# Patient Record
Sex: Female | Born: 1950 | Race: White | Hispanic: No | Marital: Married
Health system: Southern US, Community
[De-identification: ages and names within clinical notes are randomized; demographics above are authoritative.]

---

## 2012-10-15 ENCOUNTER — Ambulatory Visit (INDEPENDENT_AMBULATORY_CARE_PROVIDER_SITE_OTHER): Payer: Medicare Other

## 2012-10-15 ENCOUNTER — Other Ambulatory Visit: Payer: Self-pay | Admitting: Family Medicine

## 2012-10-15 ENCOUNTER — Other Ambulatory Visit (HOSPITAL_BASED_OUTPATIENT_CLINIC_OR_DEPARTMENT_OTHER): Payer: Self-pay | Admitting: Family Medicine

## 2012-10-15 DIAGNOSIS — Z1231 Encounter for screening mammogram for malignant neoplasm of breast: Secondary | ICD-10-CM

## 2012-10-15 DIAGNOSIS — R928 Other abnormal and inconclusive findings on diagnostic imaging of breast: Secondary | ICD-10-CM

## 2012-10-15 DIAGNOSIS — Z780 Asymptomatic menopausal state: Secondary | ICD-10-CM

## 2012-10-15 DIAGNOSIS — Z13820 Encounter for screening for osteoporosis: Secondary | ICD-10-CM

## 2012-10-17 ENCOUNTER — Other Ambulatory Visit: Payer: Self-pay | Admitting: Family Medicine

## 2012-10-17 DIAGNOSIS — R928 Other abnormal and inconclusive findings on diagnostic imaging of breast: Secondary | ICD-10-CM

## 2012-10-30 ENCOUNTER — Ambulatory Visit
Admission: RE | Admit: 2012-10-30 | Discharge: 2012-10-30 | Disposition: A | Discharge status: Home / Self Care | Payer: Medicare Other | Source: Ambulatory Visit | Attending: Family Medicine | Admitting: Family Medicine

## 2012-10-30 DIAGNOSIS — R928 Other abnormal and inconclusive findings on diagnostic imaging of breast: Secondary | ICD-10-CM

## 2013-11-18 ENCOUNTER — Telehealth: Payer: Self-pay | Admitting: *Deleted

## 2013-11-18 NOTE — Telephone Encounter (Signed)
opened in error

## 2014-08-13 ENCOUNTER — Other Ambulatory Visit: Payer: Self-pay | Admitting: Family Medicine

## 2014-08-13 DIAGNOSIS — Z1231 Encounter for screening mammogram for malignant neoplasm of breast: Secondary | ICD-10-CM

## 2014-10-01 DIAGNOSIS — Z7901 Long term (current) use of anticoagulants: Secondary | ICD-10-CM | POA: Diagnosis not present

## 2014-10-19 DIAGNOSIS — Z79899 Other long term (current) drug therapy: Secondary | ICD-10-CM | POA: Diagnosis not present

## 2014-10-19 DIAGNOSIS — K219 Gastro-esophageal reflux disease without esophagitis: Secondary | ICD-10-CM | POA: Diagnosis not present

## 2014-10-19 DIAGNOSIS — R42 Dizziness and giddiness: Secondary | ICD-10-CM | POA: Diagnosis not present

## 2014-10-19 DIAGNOSIS — D649 Anemia, unspecified: Secondary | ICD-10-CM | POA: Diagnosis not present

## 2014-10-19 DIAGNOSIS — R55 Syncope and collapse: Secondary | ICD-10-CM | POA: Diagnosis not present

## 2014-10-19 DIAGNOSIS — I491 Atrial premature depolarization: Secondary | ICD-10-CM | POA: Diagnosis not present

## 2014-10-27 DIAGNOSIS — I890 Lymphedema, not elsewhere classified: Secondary | ICD-10-CM | POA: Diagnosis not present

## 2014-10-27 DIAGNOSIS — I83018 Varicose veins of right lower extremity with ulcer other part of lower leg: Secondary | ICD-10-CM | POA: Diagnosis not present

## 2014-10-27 DIAGNOSIS — I83028 Varicose veins of left lower extremity with ulcer other part of lower leg: Secondary | ICD-10-CM | POA: Diagnosis not present

## 2014-10-27 DIAGNOSIS — I872 Venous insufficiency (chronic) (peripheral): Secondary | ICD-10-CM | POA: Diagnosis not present

## 2014-10-28 ENCOUNTER — Ambulatory Visit: Payer: Medicare Other

## 2014-11-02 DIAGNOSIS — I83018 Varicose veins of right lower extremity with ulcer other part of lower leg: Secondary | ICD-10-CM | POA: Diagnosis not present

## 2014-11-02 DIAGNOSIS — I872 Venous insufficiency (chronic) (peripheral): Secondary | ICD-10-CM | POA: Diagnosis not present

## 2014-11-02 DIAGNOSIS — I83028 Varicose veins of left lower extremity with ulcer other part of lower leg: Secondary | ICD-10-CM | POA: Diagnosis not present

## 2014-11-02 DIAGNOSIS — I890 Lymphedema, not elsewhere classified: Secondary | ICD-10-CM | POA: Diagnosis not present

## 2014-11-04 ENCOUNTER — Ambulatory Visit (INDEPENDENT_AMBULATORY_CARE_PROVIDER_SITE_OTHER): Payer: Medicare Other

## 2014-11-04 DIAGNOSIS — Z1231 Encounter for screening mammogram for malignant neoplasm of breast: Secondary | ICD-10-CM | POA: Diagnosis not present

## 2014-11-06 DIAGNOSIS — I83019 Varicose veins of right lower extremity with ulcer of unspecified site: Secondary | ICD-10-CM | POA: Diagnosis not present

## 2014-11-06 DIAGNOSIS — I83029 Varicose veins of left lower extremity with ulcer of unspecified site: Secondary | ICD-10-CM | POA: Diagnosis not present

## 2014-11-06 DIAGNOSIS — I872 Venous insufficiency (chronic) (peripheral): Secondary | ICD-10-CM | POA: Diagnosis not present

## 2014-11-06 DIAGNOSIS — L03119 Cellulitis of unspecified part of limb: Secondary | ICD-10-CM | POA: Diagnosis not present

## 2014-11-06 DIAGNOSIS — I890 Lymphedema, not elsewhere classified: Secondary | ICD-10-CM | POA: Diagnosis not present

## 2014-11-11 DIAGNOSIS — I83028 Varicose veins of left lower extremity with ulcer other part of lower leg: Secondary | ICD-10-CM | POA: Diagnosis not present

## 2014-11-11 DIAGNOSIS — I890 Lymphedema, not elsewhere classified: Secondary | ICD-10-CM | POA: Diagnosis not present

## 2014-11-11 DIAGNOSIS — I872 Venous insufficiency (chronic) (peripheral): Secondary | ICD-10-CM | POA: Diagnosis not present

## 2014-11-11 DIAGNOSIS — I83018 Varicose veins of right lower extremity with ulcer other part of lower leg: Secondary | ICD-10-CM | POA: Diagnosis not present

## 2014-11-16 DIAGNOSIS — Z1211 Encounter for screening for malignant neoplasm of colon: Secondary | ICD-10-CM | POA: Diagnosis not present

## 2014-11-16 DIAGNOSIS — Z7901 Long term (current) use of anticoagulants: Secondary | ICD-10-CM | POA: Diagnosis not present

## 2014-11-16 DIAGNOSIS — Z79899 Other long term (current) drug therapy: Secondary | ICD-10-CM | POA: Diagnosis not present

## 2014-11-16 DIAGNOSIS — D649 Anemia, unspecified: Secondary | ICD-10-CM | POA: Diagnosis not present

## 2014-11-18 DIAGNOSIS — I83018 Varicose veins of right lower extremity with ulcer other part of lower leg: Secondary | ICD-10-CM | POA: Diagnosis not present

## 2014-11-18 DIAGNOSIS — D500 Iron deficiency anemia secondary to blood loss (chronic): Secondary | ICD-10-CM | POA: Diagnosis not present

## 2014-11-18 DIAGNOSIS — Z86711 Personal history of pulmonary embolism: Secondary | ICD-10-CM | POA: Diagnosis not present

## 2014-11-18 DIAGNOSIS — I83028 Varicose veins of left lower extremity with ulcer other part of lower leg: Secondary | ICD-10-CM | POA: Diagnosis not present

## 2014-11-18 DIAGNOSIS — Z7901 Long term (current) use of anticoagulants: Secondary | ICD-10-CM | POA: Diagnosis not present

## 2014-11-23 DIAGNOSIS — D500 Iron deficiency anemia secondary to blood loss (chronic): Secondary | ICD-10-CM | POA: Diagnosis not present

## 2014-11-23 DIAGNOSIS — Z86711 Personal history of pulmonary embolism: Secondary | ICD-10-CM | POA: Diagnosis not present

## 2014-11-23 DIAGNOSIS — Z7901 Long term (current) use of anticoagulants: Secondary | ICD-10-CM | POA: Diagnosis not present

## 2014-11-25 DIAGNOSIS — D509 Iron deficiency anemia, unspecified: Secondary | ICD-10-CM | POA: Diagnosis not present

## 2014-11-25 DIAGNOSIS — I83018 Varicose veins of right lower extremity with ulcer other part of lower leg: Secondary | ICD-10-CM | POA: Diagnosis not present

## 2014-11-25 DIAGNOSIS — I83028 Varicose veins of left lower extremity with ulcer other part of lower leg: Secondary | ICD-10-CM | POA: Diagnosis not present

## 2014-11-25 DIAGNOSIS — I872 Venous insufficiency (chronic) (peripheral): Secondary | ICD-10-CM | POA: Diagnosis not present

## 2014-11-25 DIAGNOSIS — R195 Other fecal abnormalities: Secondary | ICD-10-CM | POA: Diagnosis not present

## 2014-11-25 DIAGNOSIS — I890 Lymphedema, not elsewhere classified: Secondary | ICD-10-CM | POA: Diagnosis not present

## 2014-11-30 DIAGNOSIS — D500 Iron deficiency anemia secondary to blood loss (chronic): Secondary | ICD-10-CM | POA: Diagnosis not present

## 2014-11-30 DIAGNOSIS — I2699 Other pulmonary embolism without acute cor pulmonale: Secondary | ICD-10-CM | POA: Diagnosis not present

## 2014-11-30 DIAGNOSIS — Z7901 Long term (current) use of anticoagulants: Secondary | ICD-10-CM | POA: Diagnosis not present

## 2014-12-02 DIAGNOSIS — I890 Lymphedema, not elsewhere classified: Secondary | ICD-10-CM | POA: Diagnosis not present

## 2014-12-02 DIAGNOSIS — I872 Venous insufficiency (chronic) (peripheral): Secondary | ICD-10-CM | POA: Diagnosis not present

## 2014-12-02 DIAGNOSIS — I83018 Varicose veins of right lower extremity with ulcer other part of lower leg: Secondary | ICD-10-CM | POA: Diagnosis not present

## 2014-12-02 DIAGNOSIS — I83028 Varicose veins of left lower extremity with ulcer other part of lower leg: Secondary | ICD-10-CM | POA: Diagnosis not present

## 2014-12-02 DIAGNOSIS — I83005 Varicose veins of unspecified lower extremity with ulcer other part of foot: Secondary | ICD-10-CM | POA: Diagnosis not present

## 2014-12-07 DIAGNOSIS — Z7901 Long term (current) use of anticoagulants: Secondary | ICD-10-CM | POA: Diagnosis not present

## 2014-12-07 DIAGNOSIS — D500 Iron deficiency anemia secondary to blood loss (chronic): Secondary | ICD-10-CM | POA: Diagnosis not present

## 2014-12-07 DIAGNOSIS — I2699 Other pulmonary embolism without acute cor pulmonale: Secondary | ICD-10-CM | POA: Diagnosis not present

## 2014-12-09 DIAGNOSIS — I83028 Varicose veins of left lower extremity with ulcer other part of lower leg: Secondary | ICD-10-CM | POA: Diagnosis not present

## 2014-12-09 DIAGNOSIS — I872 Venous insufficiency (chronic) (peripheral): Secondary | ICD-10-CM | POA: Diagnosis not present

## 2014-12-09 DIAGNOSIS — I83018 Varicose veins of right lower extremity with ulcer other part of lower leg: Secondary | ICD-10-CM | POA: Diagnosis not present

## 2014-12-09 DIAGNOSIS — I83015 Varicose veins of right lower extremity with ulcer other part of foot: Secondary | ICD-10-CM | POA: Diagnosis not present

## 2014-12-09 DIAGNOSIS — I890 Lymphedema, not elsewhere classified: Secondary | ICD-10-CM | POA: Diagnosis not present

## 2014-12-11 DIAGNOSIS — Z79899 Other long term (current) drug therapy: Secondary | ICD-10-CM | POA: Diagnosis not present

## 2014-12-11 DIAGNOSIS — K5730 Diverticulosis of large intestine without perforation or abscess without bleeding: Secondary | ICD-10-CM | POA: Diagnosis not present

## 2014-12-11 DIAGNOSIS — D509 Iron deficiency anemia, unspecified: Secondary | ICD-10-CM | POA: Diagnosis not present

## 2014-12-11 DIAGNOSIS — E785 Hyperlipidemia, unspecified: Secondary | ICD-10-CM | POA: Diagnosis not present

## 2014-12-11 DIAGNOSIS — Z6841 Body Mass Index (BMI) 40.0 and over, adult: Secondary | ICD-10-CM | POA: Diagnosis not present

## 2014-12-11 DIAGNOSIS — K219 Gastro-esophageal reflux disease without esophagitis: Secondary | ICD-10-CM | POA: Diagnosis not present

## 2014-12-11 DIAGNOSIS — K648 Other hemorrhoids: Secondary | ICD-10-CM | POA: Diagnosis not present

## 2014-12-11 DIAGNOSIS — R195 Other fecal abnormalities: Secondary | ICD-10-CM | POA: Diagnosis not present

## 2014-12-11 DIAGNOSIS — K921 Melena: Secondary | ICD-10-CM | POA: Diagnosis not present

## 2014-12-11 DIAGNOSIS — Z7902 Long term (current) use of antithrombotics/antiplatelets: Secondary | ICD-10-CM | POA: Diagnosis not present

## 2014-12-11 DIAGNOSIS — Z86711 Personal history of pulmonary embolism: Secondary | ICD-10-CM | POA: Diagnosis not present

## 2014-12-16 DIAGNOSIS — I83018 Varicose veins of right lower extremity with ulcer other part of lower leg: Secondary | ICD-10-CM | POA: Diagnosis not present

## 2014-12-16 DIAGNOSIS — I83028 Varicose veins of left lower extremity with ulcer other part of lower leg: Secondary | ICD-10-CM | POA: Diagnosis not present

## 2014-12-16 DIAGNOSIS — I872 Venous insufficiency (chronic) (peripheral): Secondary | ICD-10-CM | POA: Diagnosis not present

## 2014-12-16 DIAGNOSIS — I890 Lymphedema, not elsewhere classified: Secondary | ICD-10-CM | POA: Diagnosis not present

## 2014-12-23 DIAGNOSIS — I890 Lymphedema, not elsewhere classified: Secondary | ICD-10-CM | POA: Diagnosis not present

## 2014-12-23 DIAGNOSIS — I83028 Varicose veins of left lower extremity with ulcer other part of lower leg: Secondary | ICD-10-CM | POA: Diagnosis not present

## 2014-12-23 DIAGNOSIS — I83018 Varicose veins of right lower extremity with ulcer other part of lower leg: Secondary | ICD-10-CM | POA: Diagnosis not present

## 2014-12-23 DIAGNOSIS — I872 Venous insufficiency (chronic) (peripheral): Secondary | ICD-10-CM | POA: Diagnosis not present

## 2014-12-30 DIAGNOSIS — I83019 Varicose veins of right lower extremity with ulcer of unspecified site: Secondary | ICD-10-CM | POA: Diagnosis not present

## 2014-12-30 DIAGNOSIS — I872 Venous insufficiency (chronic) (peripheral): Secondary | ICD-10-CM | POA: Diagnosis not present

## 2014-12-30 DIAGNOSIS — I890 Lymphedema, not elsewhere classified: Secondary | ICD-10-CM | POA: Diagnosis not present

## 2014-12-30 DIAGNOSIS — I83029 Varicose veins of left lower extremity with ulcer of unspecified site: Secondary | ICD-10-CM | POA: Diagnosis not present

## 2015-01-15 DIAGNOSIS — I83018 Varicose veins of right lower extremity with ulcer other part of lower leg: Secondary | ICD-10-CM | POA: Diagnosis not present

## 2015-01-15 DIAGNOSIS — Z7901 Long term (current) use of anticoagulants: Secondary | ICD-10-CM | POA: Diagnosis not present

## 2015-01-15 DIAGNOSIS — Z6841 Body Mass Index (BMI) 40.0 and over, adult: Secondary | ICD-10-CM | POA: Diagnosis not present

## 2015-01-15 DIAGNOSIS — I83028 Varicose veins of left lower extremity with ulcer other part of lower leg: Secondary | ICD-10-CM | POA: Diagnosis not present

## 2015-01-15 DIAGNOSIS — L97821 Non-pressure chronic ulcer of other part of left lower leg limited to breakdown of skin: Secondary | ICD-10-CM | POA: Diagnosis not present

## 2015-01-15 DIAGNOSIS — I890 Lymphedema, not elsewhere classified: Secondary | ICD-10-CM | POA: Diagnosis not present

## 2015-01-15 DIAGNOSIS — I872 Venous insufficiency (chronic) (peripheral): Secondary | ICD-10-CM | POA: Diagnosis not present

## 2015-01-15 DIAGNOSIS — L97811 Non-pressure chronic ulcer of other part of right lower leg limited to breakdown of skin: Secondary | ICD-10-CM | POA: Diagnosis not present

## 2015-02-25 DIAGNOSIS — L97829 Non-pressure chronic ulcer of other part of left lower leg with unspecified severity: Secondary | ICD-10-CM | POA: Diagnosis not present

## 2015-02-25 DIAGNOSIS — Z6841 Body Mass Index (BMI) 40.0 and over, adult: Secondary | ICD-10-CM | POA: Diagnosis not present

## 2015-02-25 DIAGNOSIS — I890 Lymphedema, not elsewhere classified: Secondary | ICD-10-CM | POA: Diagnosis not present

## 2015-02-25 DIAGNOSIS — I872 Venous insufficiency (chronic) (peripheral): Secondary | ICD-10-CM | POA: Diagnosis not present

## 2015-02-25 DIAGNOSIS — I83028 Varicose veins of left lower extremity with ulcer other part of lower leg: Secondary | ICD-10-CM | POA: Diagnosis not present

## 2015-02-26 DIAGNOSIS — Z7901 Long term (current) use of anticoagulants: Secondary | ICD-10-CM | POA: Diagnosis not present

## 2015-03-10 DIAGNOSIS — I890 Lymphedema, not elsewhere classified: Secondary | ICD-10-CM | POA: Diagnosis not present

## 2015-03-10 DIAGNOSIS — I83029 Varicose veins of left lower extremity with ulcer of unspecified site: Secondary | ICD-10-CM | POA: Diagnosis not present

## 2015-03-10 DIAGNOSIS — I872 Venous insufficiency (chronic) (peripheral): Secondary | ICD-10-CM | POA: Diagnosis not present

## 2015-03-25 DIAGNOSIS — S80822A Blister (nonthermal), left lower leg, initial encounter: Secondary | ICD-10-CM | POA: Diagnosis not present

## 2015-03-25 DIAGNOSIS — I872 Venous insufficiency (chronic) (peripheral): Secondary | ICD-10-CM | POA: Diagnosis not present

## 2015-03-25 DIAGNOSIS — L97829 Non-pressure chronic ulcer of other part of left lower leg with unspecified severity: Secondary | ICD-10-CM | POA: Diagnosis not present

## 2015-03-25 DIAGNOSIS — I890 Lymphedema, not elsewhere classified: Secondary | ICD-10-CM | POA: Diagnosis not present

## 2015-03-26 DIAGNOSIS — I872 Venous insufficiency (chronic) (peripheral): Secondary | ICD-10-CM | POA: Diagnosis not present

## 2015-04-01 DIAGNOSIS — Z7901 Long term (current) use of anticoagulants: Secondary | ICD-10-CM | POA: Diagnosis not present

## 2015-04-08 DIAGNOSIS — I890 Lymphedema, not elsewhere classified: Secondary | ICD-10-CM | POA: Diagnosis not present

## 2015-04-08 DIAGNOSIS — I83029 Varicose veins of left lower extremity with ulcer of unspecified site: Secondary | ICD-10-CM | POA: Diagnosis not present

## 2015-04-08 DIAGNOSIS — I872 Venous insufficiency (chronic) (peripheral): Secondary | ICD-10-CM | POA: Diagnosis not present

## 2015-04-19 DIAGNOSIS — Z7901 Long term (current) use of anticoagulants: Secondary | ICD-10-CM | POA: Diagnosis not present

## 2015-04-19 DIAGNOSIS — Z86711 Personal history of pulmonary embolism: Secondary | ICD-10-CM | POA: Diagnosis not present

## 2015-04-19 DIAGNOSIS — D509 Iron deficiency anemia, unspecified: Secondary | ICD-10-CM | POA: Diagnosis not present

## 2015-04-22 DIAGNOSIS — I872 Venous insufficiency (chronic) (peripheral): Secondary | ICD-10-CM | POA: Diagnosis not present

## 2015-04-22 DIAGNOSIS — I890 Lymphedema, not elsewhere classified: Secondary | ICD-10-CM | POA: Diagnosis not present

## 2015-04-22 DIAGNOSIS — I83029 Varicose veins of left lower extremity with ulcer of unspecified site: Secondary | ICD-10-CM | POA: Diagnosis not present

## 2015-04-27 DIAGNOSIS — H02831 Dermatochalasis of right upper eyelid: Secondary | ICD-10-CM | POA: Diagnosis not present

## 2015-04-27 DIAGNOSIS — H33301 Unspecified retinal break, right eye: Secondary | ICD-10-CM | POA: Diagnosis not present

## 2015-04-27 DIAGNOSIS — H527 Unspecified disorder of refraction: Secondary | ICD-10-CM | POA: Diagnosis not present

## 2015-04-27 DIAGNOSIS — H43811 Vitreous degeneration, right eye: Secondary | ICD-10-CM | POA: Diagnosis not present

## 2015-04-27 DIAGNOSIS — H4311 Vitreous hemorrhage, right eye: Secondary | ICD-10-CM | POA: Diagnosis not present

## 2015-04-27 DIAGNOSIS — H33321 Round hole, right eye: Secondary | ICD-10-CM | POA: Diagnosis not present

## 2015-04-27 DIAGNOSIS — H25813 Combined forms of age-related cataract, bilateral: Secondary | ICD-10-CM | POA: Diagnosis not present

## 2015-04-29 DIAGNOSIS — Z7901 Long term (current) use of anticoagulants: Secondary | ICD-10-CM | POA: Diagnosis not present

## 2015-05-07 DIAGNOSIS — Z7901 Long term (current) use of anticoagulants: Secondary | ICD-10-CM | POA: Diagnosis not present

## 2015-05-11 DIAGNOSIS — L97909 Non-pressure chronic ulcer of unspecified part of unspecified lower leg with unspecified severity: Secondary | ICD-10-CM | POA: Diagnosis not present

## 2015-05-11 DIAGNOSIS — I83029 Varicose veins of left lower extremity with ulcer of unspecified site: Secondary | ICD-10-CM | POA: Diagnosis not present

## 2015-05-11 DIAGNOSIS — I872 Venous insufficiency (chronic) (peripheral): Secondary | ICD-10-CM | POA: Diagnosis not present

## 2015-05-11 DIAGNOSIS — I890 Lymphedema, not elsewhere classified: Secondary | ICD-10-CM | POA: Diagnosis not present

## 2015-05-24 DIAGNOSIS — Z7901 Long term (current) use of anticoagulants: Secondary | ICD-10-CM | POA: Diagnosis not present

## 2015-06-01 DIAGNOSIS — I890 Lymphedema, not elsewhere classified: Secondary | ICD-10-CM | POA: Diagnosis not present

## 2015-06-01 DIAGNOSIS — I83029 Varicose veins of left lower extremity with ulcer of unspecified site: Secondary | ICD-10-CM | POA: Diagnosis not present

## 2015-06-01 DIAGNOSIS — I872 Venous insufficiency (chronic) (peripheral): Secondary | ICD-10-CM | POA: Diagnosis not present

## 2015-06-02 DIAGNOSIS — Z7901 Long term (current) use of anticoagulants: Secondary | ICD-10-CM | POA: Diagnosis not present

## 2015-06-02 DIAGNOSIS — E785 Hyperlipidemia, unspecified: Secondary | ICD-10-CM | POA: Diagnosis not present

## 2015-06-02 DIAGNOSIS — K219 Gastro-esophageal reflux disease without esophagitis: Secondary | ICD-10-CM | POA: Diagnosis not present

## 2015-06-02 DIAGNOSIS — Z79899 Other long term (current) drug therapy: Secondary | ICD-10-CM | POA: Diagnosis not present

## 2015-06-02 DIAGNOSIS — Z0001 Encounter for general adult medical examination with abnormal findings: Secondary | ICD-10-CM | POA: Diagnosis not present

## 2015-06-02 DIAGNOSIS — R3915 Urgency of urination: Secondary | ICD-10-CM | POA: Diagnosis not present

## 2015-06-02 DIAGNOSIS — Z0000 Encounter for general adult medical examination without abnormal findings: Secondary | ICD-10-CM | POA: Diagnosis not present

## 2015-06-03 ENCOUNTER — Other Ambulatory Visit: Payer: Self-pay | Admitting: Family Medicine

## 2015-06-03 DIAGNOSIS — Z780 Asymptomatic menopausal state: Secondary | ICD-10-CM

## 2015-06-04 DIAGNOSIS — Z0001 Encounter for general adult medical examination with abnormal findings: Secondary | ICD-10-CM | POA: Diagnosis not present

## 2015-06-08 DIAGNOSIS — L97909 Non-pressure chronic ulcer of unspecified part of unspecified lower leg with unspecified severity: Secondary | ICD-10-CM | POA: Diagnosis not present

## 2015-06-16 DIAGNOSIS — I872 Venous insufficiency (chronic) (peripheral): Secondary | ICD-10-CM | POA: Diagnosis not present

## 2015-06-22 DIAGNOSIS — I890 Lymphedema, not elsewhere classified: Secondary | ICD-10-CM | POA: Diagnosis not present

## 2015-06-22 DIAGNOSIS — I872 Venous insufficiency (chronic) (peripheral): Secondary | ICD-10-CM | POA: Diagnosis not present

## 2015-06-22 DIAGNOSIS — L03115 Cellulitis of right lower limb: Secondary | ICD-10-CM | POA: Diagnosis not present

## 2015-06-22 DIAGNOSIS — L97821 Non-pressure chronic ulcer of other part of left lower leg limited to breakdown of skin: Secondary | ICD-10-CM | POA: Diagnosis not present

## 2015-06-22 DIAGNOSIS — L97811 Non-pressure chronic ulcer of other part of right lower leg limited to breakdown of skin: Secondary | ICD-10-CM | POA: Diagnosis not present

## 2015-06-22 DIAGNOSIS — L03116 Cellulitis of left lower limb: Secondary | ICD-10-CM | POA: Diagnosis not present

## 2015-06-28 DIAGNOSIS — Z7901 Long term (current) use of anticoagulants: Secondary | ICD-10-CM | POA: Diagnosis not present

## 2015-06-29 DIAGNOSIS — L97811 Non-pressure chronic ulcer of other part of right lower leg limited to breakdown of skin: Secondary | ICD-10-CM | POA: Diagnosis not present

## 2015-06-29 DIAGNOSIS — B369 Superficial mycosis, unspecified: Secondary | ICD-10-CM | POA: Diagnosis not present

## 2015-06-29 DIAGNOSIS — L97821 Non-pressure chronic ulcer of other part of left lower leg limited to breakdown of skin: Secondary | ICD-10-CM | POA: Diagnosis not present

## 2015-06-29 DIAGNOSIS — I890 Lymphedema, not elsewhere classified: Secondary | ICD-10-CM | POA: Diagnosis not present

## 2015-06-29 DIAGNOSIS — I872 Venous insufficiency (chronic) (peripheral): Secondary | ICD-10-CM | POA: Diagnosis not present

## 2015-07-01 ENCOUNTER — Ambulatory Visit (INDEPENDENT_AMBULATORY_CARE_PROVIDER_SITE_OTHER): Payer: Medicare Other

## 2015-07-01 DIAGNOSIS — M859 Disorder of bone density and structure, unspecified: Secondary | ICD-10-CM

## 2015-07-01 DIAGNOSIS — Z780 Asymptomatic menopausal state: Secondary | ICD-10-CM | POA: Diagnosis not present

## 2015-07-01 DIAGNOSIS — Z13820 Encounter for screening for osteoporosis: Secondary | ICD-10-CM | POA: Diagnosis not present

## 2015-07-05 DIAGNOSIS — M899 Disorder of bone, unspecified: Secondary | ICD-10-CM | POA: Diagnosis not present

## 2015-07-05 DIAGNOSIS — M8580 Other specified disorders of bone density and structure, unspecified site: Secondary | ICD-10-CM | POA: Diagnosis not present

## 2015-07-05 DIAGNOSIS — Z7901 Long term (current) use of anticoagulants: Secondary | ICD-10-CM | POA: Diagnosis not present

## 2015-07-08 DIAGNOSIS — M79662 Pain in left lower leg: Secondary | ICD-10-CM | POA: Diagnosis not present

## 2015-07-08 DIAGNOSIS — L97921 Non-pressure chronic ulcer of unspecified part of left lower leg limited to breakdown of skin: Secondary | ICD-10-CM | POA: Diagnosis not present

## 2015-07-08 DIAGNOSIS — I872 Venous insufficiency (chronic) (peripheral): Secondary | ICD-10-CM | POA: Diagnosis not present

## 2015-07-08 DIAGNOSIS — M79661 Pain in right lower leg: Secondary | ICD-10-CM | POA: Diagnosis not present

## 2015-07-08 DIAGNOSIS — I890 Lymphedema, not elsewhere classified: Secondary | ICD-10-CM | POA: Diagnosis not present

## 2015-07-08 DIAGNOSIS — L97911 Non-pressure chronic ulcer of unspecified part of right lower leg limited to breakdown of skin: Secondary | ICD-10-CM | POA: Diagnosis not present

## 2015-07-08 DIAGNOSIS — I83019 Varicose veins of right lower extremity with ulcer of unspecified site: Secondary | ICD-10-CM | POA: Diagnosis not present

## 2015-07-08 DIAGNOSIS — L97909 Non-pressure chronic ulcer of unspecified part of unspecified lower leg with unspecified severity: Secondary | ICD-10-CM | POA: Diagnosis not present

## 2015-07-08 DIAGNOSIS — I83029 Varicose veins of left lower extremity with ulcer of unspecified site: Secondary | ICD-10-CM | POA: Diagnosis not present

## 2015-07-15 DIAGNOSIS — I83028 Varicose veins of left lower extremity with ulcer other part of lower leg: Secondary | ICD-10-CM | POA: Diagnosis not present

## 2015-07-15 DIAGNOSIS — I890 Lymphedema, not elsewhere classified: Secondary | ICD-10-CM | POA: Diagnosis not present

## 2015-07-15 DIAGNOSIS — L97811 Non-pressure chronic ulcer of other part of right lower leg limited to breakdown of skin: Secondary | ICD-10-CM | POA: Diagnosis not present

## 2015-07-15 DIAGNOSIS — L97821 Non-pressure chronic ulcer of other part of left lower leg limited to breakdown of skin: Secondary | ICD-10-CM | POA: Diagnosis not present

## 2015-07-15 DIAGNOSIS — I83012 Varicose veins of right lower extremity with ulcer of calf: Secondary | ICD-10-CM | POA: Diagnosis not present

## 2015-07-15 DIAGNOSIS — I872 Venous insufficiency (chronic) (peripheral): Secondary | ICD-10-CM | POA: Diagnosis not present

## 2015-07-15 DIAGNOSIS — L97221 Non-pressure chronic ulcer of left calf limited to breakdown of skin: Secondary | ICD-10-CM | POA: Diagnosis not present

## 2015-07-15 DIAGNOSIS — L97211 Non-pressure chronic ulcer of right calf limited to breakdown of skin: Secondary | ICD-10-CM | POA: Diagnosis not present

## 2015-07-15 DIAGNOSIS — I83018 Varicose veins of right lower extremity with ulcer other part of lower leg: Secondary | ICD-10-CM | POA: Diagnosis not present

## 2015-07-15 DIAGNOSIS — I83022 Varicose veins of left lower extremity with ulcer of calf: Secondary | ICD-10-CM | POA: Diagnosis not present

## 2015-07-22 DIAGNOSIS — L97221 Non-pressure chronic ulcer of left calf limited to breakdown of skin: Secondary | ICD-10-CM | POA: Diagnosis not present

## 2015-07-22 DIAGNOSIS — I872 Venous insufficiency (chronic) (peripheral): Secondary | ICD-10-CM | POA: Diagnosis not present

## 2015-07-22 DIAGNOSIS — I83028 Varicose veins of left lower extremity with ulcer other part of lower leg: Secondary | ICD-10-CM | POA: Diagnosis not present

## 2015-07-22 DIAGNOSIS — L97211 Non-pressure chronic ulcer of right calf limited to breakdown of skin: Secondary | ICD-10-CM | POA: Diagnosis not present

## 2015-07-22 DIAGNOSIS — L97811 Non-pressure chronic ulcer of other part of right lower leg limited to breakdown of skin: Secondary | ICD-10-CM | POA: Diagnosis not present

## 2015-07-22 DIAGNOSIS — I83022 Varicose veins of left lower extremity with ulcer of calf: Secondary | ICD-10-CM | POA: Diagnosis not present

## 2015-07-22 DIAGNOSIS — I83012 Varicose veins of right lower extremity with ulcer of calf: Secondary | ICD-10-CM | POA: Diagnosis not present

## 2015-07-22 DIAGNOSIS — I83018 Varicose veins of right lower extremity with ulcer other part of lower leg: Secondary | ICD-10-CM | POA: Diagnosis not present

## 2015-07-22 DIAGNOSIS — L97821 Non-pressure chronic ulcer of other part of left lower leg limited to breakdown of skin: Secondary | ICD-10-CM | POA: Diagnosis not present

## 2015-07-22 DIAGNOSIS — I890 Lymphedema, not elsewhere classified: Secondary | ICD-10-CM | POA: Diagnosis not present

## 2015-07-27 DIAGNOSIS — Z7901 Long term (current) use of anticoagulants: Secondary | ICD-10-CM | POA: Diagnosis not present

## 2015-07-29 DIAGNOSIS — L97811 Non-pressure chronic ulcer of other part of right lower leg limited to breakdown of skin: Secondary | ICD-10-CM | POA: Diagnosis not present

## 2015-07-29 DIAGNOSIS — I890 Lymphedema, not elsewhere classified: Secondary | ICD-10-CM | POA: Diagnosis not present

## 2015-07-29 DIAGNOSIS — L03115 Cellulitis of right lower limb: Secondary | ICD-10-CM | POA: Diagnosis not present

## 2015-07-29 DIAGNOSIS — L97821 Non-pressure chronic ulcer of other part of left lower leg limited to breakdown of skin: Secondary | ICD-10-CM | POA: Diagnosis not present

## 2015-07-29 DIAGNOSIS — I872 Venous insufficiency (chronic) (peripheral): Secondary | ICD-10-CM | POA: Diagnosis not present

## 2015-08-02 DIAGNOSIS — Z7901 Long term (current) use of anticoagulants: Secondary | ICD-10-CM | POA: Diagnosis not present

## 2015-08-05 DIAGNOSIS — I83029 Varicose veins of left lower extremity with ulcer of unspecified site: Secondary | ICD-10-CM | POA: Diagnosis not present

## 2015-08-05 DIAGNOSIS — I890 Lymphedema, not elsewhere classified: Secondary | ICD-10-CM | POA: Diagnosis not present

## 2015-08-05 DIAGNOSIS — I872 Venous insufficiency (chronic) (peripheral): Secondary | ICD-10-CM | POA: Diagnosis not present

## 2015-08-05 DIAGNOSIS — I83019 Varicose veins of right lower extremity with ulcer of unspecified site: Secondary | ICD-10-CM | POA: Diagnosis not present

## 2015-08-09 DIAGNOSIS — Z7901 Long term (current) use of anticoagulants: Secondary | ICD-10-CM | POA: Diagnosis not present

## 2015-08-11 DIAGNOSIS — H43811 Vitreous degeneration, right eye: Secondary | ICD-10-CM | POA: Diagnosis not present

## 2015-08-11 DIAGNOSIS — H4311 Vitreous hemorrhage, right eye: Secondary | ICD-10-CM | POA: Diagnosis not present

## 2015-08-11 DIAGNOSIS — H527 Unspecified disorder of refraction: Secondary | ICD-10-CM | POA: Diagnosis not present

## 2015-08-11 DIAGNOSIS — H33301 Unspecified retinal break, right eye: Secondary | ICD-10-CM | POA: Diagnosis not present

## 2015-08-12 DIAGNOSIS — I872 Venous insufficiency (chronic) (peripheral): Secondary | ICD-10-CM | POA: Diagnosis not present

## 2015-08-12 DIAGNOSIS — I83019 Varicose veins of right lower extremity with ulcer of unspecified site: Secondary | ICD-10-CM | POA: Diagnosis not present

## 2015-08-12 DIAGNOSIS — I83029 Varicose veins of left lower extremity with ulcer of unspecified site: Secondary | ICD-10-CM | POA: Diagnosis not present

## 2015-08-12 DIAGNOSIS — I890 Lymphedema, not elsewhere classified: Secondary | ICD-10-CM | POA: Diagnosis not present

## 2015-08-16 DIAGNOSIS — Z7901 Long term (current) use of anticoagulants: Secondary | ICD-10-CM | POA: Diagnosis not present

## 2015-08-18 DIAGNOSIS — I83019 Varicose veins of right lower extremity with ulcer of unspecified site: Secondary | ICD-10-CM | POA: Diagnosis not present

## 2015-08-18 DIAGNOSIS — I83029 Varicose veins of left lower extremity with ulcer of unspecified site: Secondary | ICD-10-CM | POA: Diagnosis not present

## 2015-08-18 DIAGNOSIS — I890 Lymphedema, not elsewhere classified: Secondary | ICD-10-CM | POA: Diagnosis not present

## 2015-08-18 DIAGNOSIS — I872 Venous insufficiency (chronic) (peripheral): Secondary | ICD-10-CM | POA: Diagnosis not present

## 2015-08-23 DIAGNOSIS — Z7901 Long term (current) use of anticoagulants: Secondary | ICD-10-CM | POA: Diagnosis not present

## 2015-08-26 DIAGNOSIS — I872 Venous insufficiency (chronic) (peripheral): Secondary | ICD-10-CM | POA: Diagnosis not present

## 2015-08-26 DIAGNOSIS — I890 Lymphedema, not elsewhere classified: Secondary | ICD-10-CM | POA: Diagnosis not present

## 2015-08-26 DIAGNOSIS — I83029 Varicose veins of left lower extremity with ulcer of unspecified site: Secondary | ICD-10-CM | POA: Diagnosis not present

## 2015-08-26 DIAGNOSIS — I83019 Varicose veins of right lower extremity with ulcer of unspecified site: Secondary | ICD-10-CM | POA: Diagnosis not present

## 2015-09-02 DIAGNOSIS — I872 Venous insufficiency (chronic) (peripheral): Secondary | ICD-10-CM | POA: Diagnosis not present

## 2015-09-02 DIAGNOSIS — I83019 Varicose veins of right lower extremity with ulcer of unspecified site: Secondary | ICD-10-CM | POA: Diagnosis not present

## 2015-09-02 DIAGNOSIS — I890 Lymphedema, not elsewhere classified: Secondary | ICD-10-CM | POA: Diagnosis not present

## 2015-09-02 DIAGNOSIS — L97909 Non-pressure chronic ulcer of unspecified part of unspecified lower leg with unspecified severity: Secondary | ICD-10-CM | POA: Diagnosis not present

## 2015-09-02 DIAGNOSIS — I83029 Varicose veins of left lower extremity with ulcer of unspecified site: Secondary | ICD-10-CM | POA: Diagnosis not present

## 2015-09-06 DIAGNOSIS — Z7901 Long term (current) use of anticoagulants: Secondary | ICD-10-CM | POA: Diagnosis not present

## 2015-09-06 DIAGNOSIS — E559 Vitamin D deficiency, unspecified: Secondary | ICD-10-CM | POA: Diagnosis not present

## 2015-09-09 DIAGNOSIS — I872 Venous insufficiency (chronic) (peripheral): Secondary | ICD-10-CM | POA: Diagnosis not present

## 2015-09-09 DIAGNOSIS — I83019 Varicose veins of right lower extremity with ulcer of unspecified site: Secondary | ICD-10-CM | POA: Diagnosis not present

## 2015-09-09 DIAGNOSIS — L97909 Non-pressure chronic ulcer of unspecified part of unspecified lower leg with unspecified severity: Secondary | ICD-10-CM | POA: Diagnosis not present

## 2015-09-09 DIAGNOSIS — I890 Lymphedema, not elsewhere classified: Secondary | ICD-10-CM | POA: Diagnosis not present

## 2015-09-09 DIAGNOSIS — I83029 Varicose veins of left lower extremity with ulcer of unspecified site: Secondary | ICD-10-CM | POA: Diagnosis not present

## 2015-09-28 DIAGNOSIS — I890 Lymphedema, not elsewhere classified: Secondary | ICD-10-CM | POA: Diagnosis not present

## 2015-09-28 DIAGNOSIS — L97811 Non-pressure chronic ulcer of other part of right lower leg limited to breakdown of skin: Secondary | ICD-10-CM | POA: Diagnosis not present

## 2015-09-28 DIAGNOSIS — I872 Venous insufficiency (chronic) (peripheral): Secondary | ICD-10-CM | POA: Diagnosis not present

## 2015-09-28 DIAGNOSIS — R600 Localized edema: Secondary | ICD-10-CM | POA: Diagnosis not present

## 2015-09-28 DIAGNOSIS — L97221 Non-pressure chronic ulcer of left calf limited to breakdown of skin: Secondary | ICD-10-CM | POA: Diagnosis not present

## 2015-09-28 DIAGNOSIS — L97321 Non-pressure chronic ulcer of left ankle limited to breakdown of skin: Secondary | ICD-10-CM | POA: Diagnosis not present

## 2015-10-05 DIAGNOSIS — I890 Lymphedema, not elsewhere classified: Secondary | ICD-10-CM | POA: Diagnosis not present

## 2015-10-05 DIAGNOSIS — I872 Venous insufficiency (chronic) (peripheral): Secondary | ICD-10-CM | POA: Diagnosis not present

## 2015-10-05 DIAGNOSIS — L97821 Non-pressure chronic ulcer of other part of left lower leg limited to breakdown of skin: Secondary | ICD-10-CM | POA: Diagnosis not present

## 2015-10-05 DIAGNOSIS — R600 Localized edema: Secondary | ICD-10-CM | POA: Diagnosis not present

## 2015-10-05 DIAGNOSIS — L03115 Cellulitis of right lower limb: Secondary | ICD-10-CM | POA: Diagnosis not present

## 2015-10-05 DIAGNOSIS — L03116 Cellulitis of left lower limb: Secondary | ICD-10-CM | POA: Diagnosis not present

## 2015-10-05 DIAGNOSIS — L97811 Non-pressure chronic ulcer of other part of right lower leg limited to breakdown of skin: Secondary | ICD-10-CM | POA: Diagnosis not present

## 2015-10-07 ENCOUNTER — Other Ambulatory Visit: Payer: Self-pay | Admitting: Family Medicine

## 2015-10-07 DIAGNOSIS — Z1231 Encounter for screening mammogram for malignant neoplasm of breast: Secondary | ICD-10-CM

## 2015-10-11 DIAGNOSIS — Z7901 Long term (current) use of anticoagulants: Secondary | ICD-10-CM | POA: Diagnosis not present

## 2015-10-14 DIAGNOSIS — I890 Lymphedema, not elsewhere classified: Secondary | ICD-10-CM | POA: Diagnosis not present

## 2015-10-14 DIAGNOSIS — L97321 Non-pressure chronic ulcer of left ankle limited to breakdown of skin: Secondary | ICD-10-CM | POA: Diagnosis not present

## 2015-10-14 DIAGNOSIS — L97811 Non-pressure chronic ulcer of other part of right lower leg limited to breakdown of skin: Secondary | ICD-10-CM | POA: Diagnosis not present

## 2015-10-14 DIAGNOSIS — I872 Venous insufficiency (chronic) (peripheral): Secondary | ICD-10-CM | POA: Diagnosis not present

## 2015-10-14 DIAGNOSIS — L97221 Non-pressure chronic ulcer of left calf limited to breakdown of skin: Secondary | ICD-10-CM | POA: Diagnosis not present

## 2015-10-14 DIAGNOSIS — Z09 Encounter for follow-up examination after completed treatment for conditions other than malignant neoplasm: Secondary | ICD-10-CM | POA: Diagnosis not present

## 2015-10-18 DIAGNOSIS — Z7901 Long term (current) use of anticoagulants: Secondary | ICD-10-CM | POA: Diagnosis not present

## 2015-10-21 DIAGNOSIS — L97921 Non-pressure chronic ulcer of unspecified part of left lower leg limited to breakdown of skin: Secondary | ICD-10-CM | POA: Diagnosis not present

## 2015-10-21 DIAGNOSIS — L97911 Non-pressure chronic ulcer of unspecified part of right lower leg limited to breakdown of skin: Secondary | ICD-10-CM | POA: Diagnosis not present

## 2015-10-25 DIAGNOSIS — D500 Iron deficiency anemia secondary to blood loss (chronic): Secondary | ICD-10-CM | POA: Diagnosis not present

## 2015-10-25 DIAGNOSIS — Z5181 Encounter for therapeutic drug level monitoring: Secondary | ICD-10-CM | POA: Diagnosis not present

## 2015-10-25 DIAGNOSIS — I890 Lymphedema, not elsewhere classified: Secondary | ICD-10-CM | POA: Diagnosis not present

## 2015-10-25 DIAGNOSIS — Z862 Personal history of diseases of the blood and blood-forming organs and certain disorders involving the immune mechanism: Secondary | ICD-10-CM | POA: Diagnosis not present

## 2015-10-25 DIAGNOSIS — Z09 Encounter for follow-up examination after completed treatment for conditions other than malignant neoplasm: Secondary | ICD-10-CM | POA: Diagnosis not present

## 2015-10-25 DIAGNOSIS — Z7901 Long term (current) use of anticoagulants: Secondary | ICD-10-CM | POA: Diagnosis not present

## 2015-10-28 DIAGNOSIS — L97921 Non-pressure chronic ulcer of unspecified part of left lower leg limited to breakdown of skin: Secondary | ICD-10-CM | POA: Diagnosis not present

## 2015-10-28 DIAGNOSIS — I872 Venous insufficiency (chronic) (peripheral): Secondary | ICD-10-CM | POA: Diagnosis not present

## 2015-10-28 DIAGNOSIS — L97911 Non-pressure chronic ulcer of unspecified part of right lower leg limited to breakdown of skin: Secondary | ICD-10-CM | POA: Diagnosis not present

## 2015-10-28 DIAGNOSIS — I890 Lymphedema, not elsewhere classified: Secondary | ICD-10-CM | POA: Diagnosis not present

## 2015-11-01 DIAGNOSIS — Z7901 Long term (current) use of anticoagulants: Secondary | ICD-10-CM | POA: Diagnosis not present

## 2015-11-03 DIAGNOSIS — D500 Iron deficiency anemia secondary to blood loss (chronic): Secondary | ICD-10-CM | POA: Diagnosis not present

## 2015-11-03 DIAGNOSIS — Z5181 Encounter for therapeutic drug level monitoring: Secondary | ICD-10-CM | POA: Diagnosis not present

## 2015-11-03 DIAGNOSIS — Z79899 Other long term (current) drug therapy: Secondary | ICD-10-CM | POA: Diagnosis not present

## 2015-11-04 DIAGNOSIS — L97921 Non-pressure chronic ulcer of unspecified part of left lower leg limited to breakdown of skin: Secondary | ICD-10-CM | POA: Diagnosis not present

## 2015-11-04 DIAGNOSIS — I890 Lymphedema, not elsewhere classified: Secondary | ICD-10-CM | POA: Diagnosis not present

## 2015-11-04 DIAGNOSIS — I872 Venous insufficiency (chronic) (peripheral): Secondary | ICD-10-CM | POA: Diagnosis not present

## 2015-11-04 DIAGNOSIS — L97911 Non-pressure chronic ulcer of unspecified part of right lower leg limited to breakdown of skin: Secondary | ICD-10-CM | POA: Diagnosis not present

## 2015-11-10 ENCOUNTER — Ambulatory Visit (INDEPENDENT_AMBULATORY_CARE_PROVIDER_SITE_OTHER): Payer: Medicare Other

## 2015-11-10 DIAGNOSIS — Z1231 Encounter for screening mammogram for malignant neoplasm of breast: Secondary | ICD-10-CM | POA: Diagnosis not present

## 2015-11-11 DIAGNOSIS — L97911 Non-pressure chronic ulcer of unspecified part of right lower leg limited to breakdown of skin: Secondary | ICD-10-CM | POA: Diagnosis not present

## 2015-11-11 DIAGNOSIS — I890 Lymphedema, not elsewhere classified: Secondary | ICD-10-CM | POA: Diagnosis not present

## 2015-11-11 DIAGNOSIS — I872 Venous insufficiency (chronic) (peripheral): Secondary | ICD-10-CM | POA: Diagnosis not present

## 2015-11-11 DIAGNOSIS — Z79899 Other long term (current) drug therapy: Secondary | ICD-10-CM | POA: Diagnosis not present

## 2015-11-11 DIAGNOSIS — Z5181 Encounter for therapeutic drug level monitoring: Secondary | ICD-10-CM | POA: Diagnosis not present

## 2015-11-11 DIAGNOSIS — L97921 Non-pressure chronic ulcer of unspecified part of left lower leg limited to breakdown of skin: Secondary | ICD-10-CM | POA: Diagnosis not present

## 2015-11-11 DIAGNOSIS — D500 Iron deficiency anemia secondary to blood loss (chronic): Secondary | ICD-10-CM | POA: Diagnosis not present

## 2015-11-16 DIAGNOSIS — H43811 Vitreous degeneration, right eye: Secondary | ICD-10-CM | POA: Diagnosis not present

## 2015-11-16 DIAGNOSIS — H35371 Puckering of macula, right eye: Secondary | ICD-10-CM | POA: Diagnosis not present

## 2015-11-16 DIAGNOSIS — H25813 Combined forms of age-related cataract, bilateral: Secondary | ICD-10-CM | POA: Diagnosis not present

## 2015-11-16 DIAGNOSIS — H4311 Vitreous hemorrhage, right eye: Secondary | ICD-10-CM | POA: Diagnosis not present

## 2015-11-16 DIAGNOSIS — H33301 Unspecified retinal break, right eye: Secondary | ICD-10-CM | POA: Diagnosis not present

## 2015-11-18 DIAGNOSIS — I890 Lymphedema, not elsewhere classified: Secondary | ICD-10-CM | POA: Diagnosis not present

## 2015-11-18 DIAGNOSIS — I872 Venous insufficiency (chronic) (peripheral): Secondary | ICD-10-CM | POA: Diagnosis not present

## 2015-11-18 DIAGNOSIS — L97821 Non-pressure chronic ulcer of other part of left lower leg limited to breakdown of skin: Secondary | ICD-10-CM | POA: Diagnosis not present

## 2015-11-18 DIAGNOSIS — L97811 Non-pressure chronic ulcer of other part of right lower leg limited to breakdown of skin: Secondary | ICD-10-CM | POA: Diagnosis not present

## 2015-11-25 DIAGNOSIS — I890 Lymphedema, not elsewhere classified: Secondary | ICD-10-CM | POA: Diagnosis not present

## 2015-11-25 DIAGNOSIS — L97911 Non-pressure chronic ulcer of unspecified part of right lower leg limited to breakdown of skin: Secondary | ICD-10-CM | POA: Diagnosis not present

## 2015-11-25 DIAGNOSIS — L97921 Non-pressure chronic ulcer of unspecified part of left lower leg limited to breakdown of skin: Secondary | ICD-10-CM | POA: Diagnosis not present

## 2015-11-25 DIAGNOSIS — I872 Venous insufficiency (chronic) (peripheral): Secondary | ICD-10-CM | POA: Diagnosis not present

## 2015-11-26 DIAGNOSIS — Z7901 Long term (current) use of anticoagulants: Secondary | ICD-10-CM | POA: Diagnosis not present

## 2015-11-26 DIAGNOSIS — L97909 Non-pressure chronic ulcer of unspecified part of unspecified lower leg with unspecified severity: Secondary | ICD-10-CM | POA: Diagnosis not present

## 2015-11-29 DIAGNOSIS — Z7901 Long term (current) use of anticoagulants: Secondary | ICD-10-CM | POA: Diagnosis not present

## 2015-12-02 DIAGNOSIS — I872 Venous insufficiency (chronic) (peripheral): Secondary | ICD-10-CM | POA: Diagnosis not present

## 2015-12-02 DIAGNOSIS — L97921 Non-pressure chronic ulcer of unspecified part of left lower leg limited to breakdown of skin: Secondary | ICD-10-CM | POA: Diagnosis not present

## 2015-12-02 DIAGNOSIS — L97911 Non-pressure chronic ulcer of unspecified part of right lower leg limited to breakdown of skin: Secondary | ICD-10-CM | POA: Diagnosis not present

## 2015-12-02 DIAGNOSIS — I890 Lymphedema, not elsewhere classified: Secondary | ICD-10-CM | POA: Diagnosis not present

## 2015-12-09 DIAGNOSIS — Z5181 Encounter for therapeutic drug level monitoring: Secondary | ICD-10-CM | POA: Diagnosis not present

## 2015-12-09 DIAGNOSIS — D500 Iron deficiency anemia secondary to blood loss (chronic): Secondary | ICD-10-CM | POA: Diagnosis not present

## 2015-12-13 DIAGNOSIS — Z7901 Long term (current) use of anticoagulants: Secondary | ICD-10-CM | POA: Diagnosis not present

## 2016-01-10 DIAGNOSIS — Z7901 Long term (current) use of anticoagulants: Secondary | ICD-10-CM | POA: Diagnosis not present

## 2016-01-24 DIAGNOSIS — Z7901 Long term (current) use of anticoagulants: Secondary | ICD-10-CM | POA: Diagnosis not present

## 2016-02-07 DIAGNOSIS — Z7901 Long term (current) use of anticoagulants: Secondary | ICD-10-CM | POA: Diagnosis not present

## 2016-03-06 DIAGNOSIS — Z7901 Long term (current) use of anticoagulants: Secondary | ICD-10-CM | POA: Diagnosis not present

## 2016-04-10 DIAGNOSIS — Z7901 Long term (current) use of anticoagulants: Secondary | ICD-10-CM | POA: Diagnosis not present

## 2016-04-24 DIAGNOSIS — Z7901 Long term (current) use of anticoagulants: Secondary | ICD-10-CM | POA: Diagnosis not present

## 2016-05-01 DIAGNOSIS — Z862 Personal history of diseases of the blood and blood-forming organs and certain disorders involving the immune mechanism: Secondary | ICD-10-CM | POA: Diagnosis not present

## 2016-05-01 DIAGNOSIS — Z7901 Long term (current) use of anticoagulants: Secondary | ICD-10-CM | POA: Diagnosis not present

## 2016-05-01 DIAGNOSIS — Z09 Encounter for follow-up examination after completed treatment for conditions other than malignant neoplasm: Secondary | ICD-10-CM | POA: Diagnosis not present

## 2016-05-01 DIAGNOSIS — D500 Iron deficiency anemia secondary to blood loss (chronic): Secondary | ICD-10-CM | POA: Diagnosis not present

## 2016-05-08 DIAGNOSIS — Z7901 Long term (current) use of anticoagulants: Secondary | ICD-10-CM | POA: Diagnosis not present

## 2016-05-15 DIAGNOSIS — Z7901 Long term (current) use of anticoagulants: Secondary | ICD-10-CM | POA: Diagnosis not present

## 2016-05-17 DIAGNOSIS — H4311 Vitreous hemorrhage, right eye: Secondary | ICD-10-CM | POA: Diagnosis not present

## 2016-05-17 DIAGNOSIS — H33301 Unspecified retinal break, right eye: Secondary | ICD-10-CM | POA: Diagnosis not present

## 2016-05-17 DIAGNOSIS — H35371 Puckering of macula, right eye: Secondary | ICD-10-CM | POA: Diagnosis not present

## 2016-05-17 DIAGNOSIS — H43811 Vitreous degeneration, right eye: Secondary | ICD-10-CM | POA: Diagnosis not present

## 2016-05-30 DIAGNOSIS — E785 Hyperlipidemia, unspecified: Secondary | ICD-10-CM | POA: Diagnosis not present

## 2016-05-30 DIAGNOSIS — M8580 Other specified disorders of bone density and structure, unspecified site: Secondary | ICD-10-CM | POA: Diagnosis not present

## 2016-05-30 DIAGNOSIS — D509 Iron deficiency anemia, unspecified: Secondary | ICD-10-CM | POA: Diagnosis not present

## 2016-05-30 DIAGNOSIS — Z0000 Encounter for general adult medical examination without abnormal findings: Secondary | ICD-10-CM | POA: Diagnosis not present

## 2016-05-30 DIAGNOSIS — R251 Tremor, unspecified: Secondary | ICD-10-CM | POA: Diagnosis not present

## 2016-05-30 DIAGNOSIS — I890 Lymphedema, not elsewhere classified: Secondary | ICD-10-CM | POA: Diagnosis not present

## 2016-05-30 DIAGNOSIS — Z1231 Encounter for screening mammogram for malignant neoplasm of breast: Secondary | ICD-10-CM | POA: Diagnosis not present

## 2016-05-30 DIAGNOSIS — Z7901 Long term (current) use of anticoagulants: Secondary | ICD-10-CM | POA: Diagnosis not present

## 2016-06-02 DIAGNOSIS — Z0000 Encounter for general adult medical examination without abnormal findings: Secondary | ICD-10-CM | POA: Diagnosis not present

## 2016-06-02 DIAGNOSIS — R828 Abnormal findings on cytological and histological examination of urine: Secondary | ICD-10-CM | POA: Diagnosis not present

## 2016-06-02 DIAGNOSIS — Z79899 Other long term (current) drug therapy: Secondary | ICD-10-CM | POA: Diagnosis not present

## 2016-06-02 DIAGNOSIS — Z1211 Encounter for screening for malignant neoplasm of colon: Secondary | ICD-10-CM | POA: Diagnosis not present

## 2016-06-26 DIAGNOSIS — Z7901 Long term (current) use of anticoagulants: Secondary | ICD-10-CM | POA: Diagnosis not present

## 2016-07-31 DIAGNOSIS — Z7901 Long term (current) use of anticoagulants: Secondary | ICD-10-CM | POA: Diagnosis not present

## 2016-08-07 DIAGNOSIS — L980 Pyogenic granuloma: Secondary | ICD-10-CM | POA: Diagnosis not present

## 2016-08-07 DIAGNOSIS — B351 Tinea unguium: Secondary | ICD-10-CM | POA: Diagnosis not present

## 2016-08-07 DIAGNOSIS — I890 Lymphedema, not elsewhere classified: Secondary | ICD-10-CM | POA: Diagnosis not present

## 2016-08-07 DIAGNOSIS — Z7901 Long term (current) use of anticoagulants: Secondary | ICD-10-CM | POA: Diagnosis not present

## 2016-08-10 DIAGNOSIS — I872 Venous insufficiency (chronic) (peripheral): Secondary | ICD-10-CM | POA: Diagnosis not present

## 2016-08-10 DIAGNOSIS — L97321 Non-pressure chronic ulcer of left ankle limited to breakdown of skin: Secondary | ICD-10-CM | POA: Diagnosis not present

## 2016-08-10 DIAGNOSIS — L97511 Non-pressure chronic ulcer of other part of right foot limited to breakdown of skin: Secondary | ICD-10-CM | POA: Diagnosis not present

## 2016-08-10 DIAGNOSIS — L97821 Non-pressure chronic ulcer of other part of left lower leg limited to breakdown of skin: Secondary | ICD-10-CM | POA: Diagnosis not present

## 2016-08-10 DIAGNOSIS — L97909 Non-pressure chronic ulcer of unspecified part of unspecified lower leg with unspecified severity: Secondary | ICD-10-CM | POA: Diagnosis not present

## 2016-08-10 DIAGNOSIS — I890 Lymphedema, not elsewhere classified: Secondary | ICD-10-CM | POA: Diagnosis not present

## 2016-08-24 DIAGNOSIS — L97921 Non-pressure chronic ulcer of unspecified part of left lower leg limited to breakdown of skin: Secondary | ICD-10-CM | POA: Diagnosis not present

## 2016-08-24 DIAGNOSIS — I872 Venous insufficiency (chronic) (peripheral): Secondary | ICD-10-CM | POA: Diagnosis not present

## 2016-08-24 DIAGNOSIS — L03116 Cellulitis of left lower limb: Secondary | ICD-10-CM | POA: Diagnosis not present

## 2016-08-24 DIAGNOSIS — I890 Lymphedema, not elsewhere classified: Secondary | ICD-10-CM | POA: Diagnosis not present

## 2016-08-31 DIAGNOSIS — L97921 Non-pressure chronic ulcer of unspecified part of left lower leg limited to breakdown of skin: Secondary | ICD-10-CM | POA: Diagnosis not present

## 2016-08-31 DIAGNOSIS — I872 Venous insufficiency (chronic) (peripheral): Secondary | ICD-10-CM | POA: Diagnosis not present

## 2016-08-31 DIAGNOSIS — I890 Lymphedema, not elsewhere classified: Secondary | ICD-10-CM | POA: Diagnosis not present

## 2016-08-31 DIAGNOSIS — L03116 Cellulitis of left lower limb: Secondary | ICD-10-CM | POA: Diagnosis not present

## 2016-09-01 DIAGNOSIS — Z7901 Long term (current) use of anticoagulants: Secondary | ICD-10-CM | POA: Diagnosis not present

## 2016-09-07 DIAGNOSIS — I890 Lymphedema, not elsewhere classified: Secondary | ICD-10-CM | POA: Diagnosis not present

## 2016-09-07 DIAGNOSIS — I872 Venous insufficiency (chronic) (peripheral): Secondary | ICD-10-CM | POA: Diagnosis not present

## 2016-09-07 DIAGNOSIS — L97921 Non-pressure chronic ulcer of unspecified part of left lower leg limited to breakdown of skin: Secondary | ICD-10-CM | POA: Diagnosis not present

## 2016-09-08 DIAGNOSIS — Z7901 Long term (current) use of anticoagulants: Secondary | ICD-10-CM | POA: Diagnosis not present

## 2016-09-14 DIAGNOSIS — L97921 Non-pressure chronic ulcer of unspecified part of left lower leg limited to breakdown of skin: Secondary | ICD-10-CM | POA: Diagnosis not present

## 2016-09-15 DIAGNOSIS — Z7901 Long term (current) use of anticoagulants: Secondary | ICD-10-CM | POA: Diagnosis not present

## 2016-09-21 DIAGNOSIS — I890 Lymphedema, not elsewhere classified: Secondary | ICD-10-CM | POA: Diagnosis not present

## 2016-09-21 DIAGNOSIS — L97921 Non-pressure chronic ulcer of unspecified part of left lower leg limited to breakdown of skin: Secondary | ICD-10-CM | POA: Diagnosis not present

## 2016-09-21 DIAGNOSIS — I872 Venous insufficiency (chronic) (peripheral): Secondary | ICD-10-CM | POA: Diagnosis not present

## 2016-09-28 DIAGNOSIS — I872 Venous insufficiency (chronic) (peripheral): Secondary | ICD-10-CM | POA: Diagnosis not present

## 2016-09-28 DIAGNOSIS — I890 Lymphedema, not elsewhere classified: Secondary | ICD-10-CM | POA: Diagnosis not present

## 2016-09-28 DIAGNOSIS — L97921 Non-pressure chronic ulcer of unspecified part of left lower leg limited to breakdown of skin: Secondary | ICD-10-CM | POA: Diagnosis not present

## 2016-10-03 ENCOUNTER — Other Ambulatory Visit: Payer: Self-pay | Admitting: Unknown Physician Specialty

## 2016-10-03 DIAGNOSIS — Z1231 Encounter for screening mammogram for malignant neoplasm of breast: Secondary | ICD-10-CM

## 2016-10-05 DIAGNOSIS — L97921 Non-pressure chronic ulcer of unspecified part of left lower leg limited to breakdown of skin: Secondary | ICD-10-CM | POA: Diagnosis not present

## 2016-10-05 DIAGNOSIS — I890 Lymphedema, not elsewhere classified: Secondary | ICD-10-CM | POA: Diagnosis not present

## 2016-10-05 DIAGNOSIS — I872 Venous insufficiency (chronic) (peripheral): Secondary | ICD-10-CM | POA: Diagnosis not present

## 2016-10-16 DIAGNOSIS — Z7901 Long term (current) use of anticoagulants: Secondary | ICD-10-CM | POA: Diagnosis not present

## 2016-10-17 DIAGNOSIS — L97921 Non-pressure chronic ulcer of unspecified part of left lower leg limited to breakdown of skin: Secondary | ICD-10-CM | POA: Diagnosis not present

## 2016-10-24 DIAGNOSIS — L97921 Non-pressure chronic ulcer of unspecified part of left lower leg limited to breakdown of skin: Secondary | ICD-10-CM | POA: Diagnosis not present

## 2016-10-24 DIAGNOSIS — I872 Venous insufficiency (chronic) (peripheral): Secondary | ICD-10-CM | POA: Diagnosis not present

## 2016-10-24 DIAGNOSIS — I890 Lymphedema, not elsewhere classified: Secondary | ICD-10-CM | POA: Diagnosis not present

## 2016-10-31 DIAGNOSIS — I872 Venous insufficiency (chronic) (peripheral): Secondary | ICD-10-CM | POA: Diagnosis not present

## 2016-10-31 DIAGNOSIS — L97921 Non-pressure chronic ulcer of unspecified part of left lower leg limited to breakdown of skin: Secondary | ICD-10-CM | POA: Diagnosis not present

## 2016-11-06 DIAGNOSIS — D500 Iron deficiency anemia secondary to blood loss (chronic): Secondary | ICD-10-CM | POA: Diagnosis not present

## 2016-11-06 DIAGNOSIS — L97929 Non-pressure chronic ulcer of unspecified part of left lower leg with unspecified severity: Secondary | ICD-10-CM | POA: Diagnosis not present

## 2016-11-06 DIAGNOSIS — Z7901 Long term (current) use of anticoagulants: Secondary | ICD-10-CM | POA: Diagnosis not present

## 2016-11-06 DIAGNOSIS — Z5181 Encounter for therapeutic drug level monitoring: Secondary | ICD-10-CM | POA: Diagnosis not present

## 2016-11-07 DIAGNOSIS — L97921 Non-pressure chronic ulcer of unspecified part of left lower leg limited to breakdown of skin: Secondary | ICD-10-CM | POA: Diagnosis not present

## 2016-11-07 DIAGNOSIS — I872 Venous insufficiency (chronic) (peripheral): Secondary | ICD-10-CM | POA: Diagnosis not present

## 2016-11-07 DIAGNOSIS — I890 Lymphedema, not elsewhere classified: Secondary | ICD-10-CM | POA: Diagnosis not present

## 2016-11-10 ENCOUNTER — Ambulatory Visit (INDEPENDENT_AMBULATORY_CARE_PROVIDER_SITE_OTHER): Payer: Medicare Other

## 2016-11-10 DIAGNOSIS — Z1231 Encounter for screening mammogram for malignant neoplasm of breast: Secondary | ICD-10-CM | POA: Diagnosis not present

## 2016-11-14 DIAGNOSIS — L97921 Non-pressure chronic ulcer of unspecified part of left lower leg limited to breakdown of skin: Secondary | ICD-10-CM | POA: Diagnosis not present

## 2016-11-14 DIAGNOSIS — I890 Lymphedema, not elsewhere classified: Secondary | ICD-10-CM | POA: Diagnosis not present

## 2016-11-14 DIAGNOSIS — I872 Venous insufficiency (chronic) (peripheral): Secondary | ICD-10-CM | POA: Diagnosis not present

## 2016-11-21 DIAGNOSIS — L97921 Non-pressure chronic ulcer of unspecified part of left lower leg limited to breakdown of skin: Secondary | ICD-10-CM | POA: Diagnosis not present

## 2016-11-21 DIAGNOSIS — I890 Lymphedema, not elsewhere classified: Secondary | ICD-10-CM | POA: Diagnosis not present

## 2016-11-21 DIAGNOSIS — I872 Venous insufficiency (chronic) (peripheral): Secondary | ICD-10-CM | POA: Diagnosis not present

## 2016-11-23 DIAGNOSIS — E785 Hyperlipidemia, unspecified: Secondary | ICD-10-CM | POA: Diagnosis not present

## 2016-11-23 DIAGNOSIS — Z7901 Long term (current) use of anticoagulants: Secondary | ICD-10-CM | POA: Diagnosis not present

## 2016-11-23 DIAGNOSIS — Z86711 Personal history of pulmonary embolism: Secondary | ICD-10-CM | POA: Diagnosis not present

## 2016-11-23 DIAGNOSIS — E559 Vitamin D deficiency, unspecified: Secondary | ICD-10-CM | POA: Diagnosis not present

## 2016-11-28 DIAGNOSIS — L97921 Non-pressure chronic ulcer of unspecified part of left lower leg limited to breakdown of skin: Secondary | ICD-10-CM | POA: Diagnosis not present

## 2016-11-28 DIAGNOSIS — I890 Lymphedema, not elsewhere classified: Secondary | ICD-10-CM | POA: Diagnosis not present

## 2016-11-28 DIAGNOSIS — I872 Venous insufficiency (chronic) (peripheral): Secondary | ICD-10-CM | POA: Diagnosis not present

## 2016-12-05 DIAGNOSIS — L97921 Non-pressure chronic ulcer of unspecified part of left lower leg limited to breakdown of skin: Secondary | ICD-10-CM | POA: Diagnosis not present

## 2016-12-05 DIAGNOSIS — I890 Lymphedema, not elsewhere classified: Secondary | ICD-10-CM | POA: Diagnosis not present

## 2016-12-05 DIAGNOSIS — I872 Venous insufficiency (chronic) (peripheral): Secondary | ICD-10-CM | POA: Diagnosis not present

## 2016-12-12 DIAGNOSIS — I890 Lymphedema, not elsewhere classified: Secondary | ICD-10-CM | POA: Diagnosis not present

## 2016-12-12 DIAGNOSIS — I872 Venous insufficiency (chronic) (peripheral): Secondary | ICD-10-CM | POA: Diagnosis not present

## 2016-12-12 DIAGNOSIS — L97921 Non-pressure chronic ulcer of unspecified part of left lower leg limited to breakdown of skin: Secondary | ICD-10-CM | POA: Diagnosis not present

## 2016-12-19 DIAGNOSIS — L97921 Non-pressure chronic ulcer of unspecified part of left lower leg limited to breakdown of skin: Secondary | ICD-10-CM | POA: Diagnosis not present

## 2016-12-19 DIAGNOSIS — I890 Lymphedema, not elsewhere classified: Secondary | ICD-10-CM | POA: Diagnosis not present

## 2016-12-19 DIAGNOSIS — I872 Venous insufficiency (chronic) (peripheral): Secondary | ICD-10-CM | POA: Diagnosis not present

## 2016-12-26 DIAGNOSIS — I872 Venous insufficiency (chronic) (peripheral): Secondary | ICD-10-CM | POA: Diagnosis not present

## 2016-12-26 DIAGNOSIS — I890 Lymphedema, not elsewhere classified: Secondary | ICD-10-CM | POA: Diagnosis not present

## 2016-12-26 DIAGNOSIS — L97921 Non-pressure chronic ulcer of unspecified part of left lower leg limited to breakdown of skin: Secondary | ICD-10-CM | POA: Diagnosis not present

## 2017-01-02 DIAGNOSIS — I872 Venous insufficiency (chronic) (peripheral): Secondary | ICD-10-CM | POA: Diagnosis not present

## 2017-01-02 DIAGNOSIS — L089 Local infection of the skin and subcutaneous tissue, unspecified: Secondary | ICD-10-CM | POA: Diagnosis not present

## 2017-01-02 DIAGNOSIS — L97921 Non-pressure chronic ulcer of unspecified part of left lower leg limited to breakdown of skin: Secondary | ICD-10-CM | POA: Diagnosis not present

## 2017-01-02 DIAGNOSIS — I890 Lymphedema, not elsewhere classified: Secondary | ICD-10-CM | POA: Diagnosis not present

## 2017-01-08 DIAGNOSIS — Z7901 Long term (current) use of anticoagulants: Secondary | ICD-10-CM | POA: Diagnosis not present

## 2017-01-09 DIAGNOSIS — I890 Lymphedema, not elsewhere classified: Secondary | ICD-10-CM | POA: Diagnosis not present

## 2017-01-09 DIAGNOSIS — L97921 Non-pressure chronic ulcer of unspecified part of left lower leg limited to breakdown of skin: Secondary | ICD-10-CM | POA: Diagnosis not present

## 2017-01-09 DIAGNOSIS — I872 Venous insufficiency (chronic) (peripheral): Secondary | ICD-10-CM | POA: Diagnosis not present

## 2017-01-09 DIAGNOSIS — L97909 Non-pressure chronic ulcer of unspecified part of unspecified lower leg with unspecified severity: Secondary | ICD-10-CM | POA: Diagnosis not present

## 2017-01-22 DIAGNOSIS — L97921 Non-pressure chronic ulcer of unspecified part of left lower leg limited to breakdown of skin: Secondary | ICD-10-CM | POA: Diagnosis not present

## 2017-01-22 DIAGNOSIS — I872 Venous insufficiency (chronic) (peripheral): Secondary | ICD-10-CM | POA: Diagnosis not present

## 2017-01-23 DIAGNOSIS — Z7901 Long term (current) use of anticoagulants: Secondary | ICD-10-CM | POA: Diagnosis not present

## 2017-01-29 DIAGNOSIS — L97921 Non-pressure chronic ulcer of unspecified part of left lower leg limited to breakdown of skin: Secondary | ICD-10-CM | POA: Diagnosis not present

## 2017-01-29 DIAGNOSIS — L97909 Non-pressure chronic ulcer of unspecified part of unspecified lower leg with unspecified severity: Secondary | ICD-10-CM | POA: Diagnosis not present

## 2017-02-05 DIAGNOSIS — I83029 Varicose veins of left lower extremity with ulcer of unspecified site: Secondary | ICD-10-CM | POA: Diagnosis not present

## 2017-02-05 DIAGNOSIS — L97929 Non-pressure chronic ulcer of unspecified part of left lower leg with unspecified severity: Secondary | ICD-10-CM | POA: Diagnosis not present

## 2017-02-05 DIAGNOSIS — L97921 Non-pressure chronic ulcer of unspecified part of left lower leg limited to breakdown of skin: Secondary | ICD-10-CM | POA: Diagnosis not present

## 2017-02-12 DIAGNOSIS — I872 Venous insufficiency (chronic) (peripheral): Secondary | ICD-10-CM | POA: Diagnosis not present

## 2017-02-12 DIAGNOSIS — L97929 Non-pressure chronic ulcer of unspecified part of left lower leg with unspecified severity: Secondary | ICD-10-CM | POA: Diagnosis not present

## 2017-02-12 DIAGNOSIS — I890 Lymphedema, not elsewhere classified: Secondary | ICD-10-CM | POA: Diagnosis not present

## 2017-02-12 DIAGNOSIS — I83029 Varicose veins of left lower extremity with ulcer of unspecified site: Secondary | ICD-10-CM | POA: Diagnosis not present

## 2017-02-12 DIAGNOSIS — L97921 Non-pressure chronic ulcer of unspecified part of left lower leg limited to breakdown of skin: Secondary | ICD-10-CM | POA: Diagnosis not present

## 2017-02-20 DIAGNOSIS — S80822A Blister (nonthermal), left lower leg, initial encounter: Secondary | ICD-10-CM | POA: Diagnosis not present

## 2017-02-20 DIAGNOSIS — Z22322 Carrier or suspected carrier of Methicillin resistant Staphylococcus aureus: Secondary | ICD-10-CM | POA: Diagnosis not present

## 2017-02-20 DIAGNOSIS — L089 Local infection of the skin and subcutaneous tissue, unspecified: Secondary | ICD-10-CM | POA: Diagnosis not present

## 2017-02-20 DIAGNOSIS — I83029 Varicose veins of left lower extremity with ulcer of unspecified site: Secondary | ICD-10-CM | POA: Diagnosis not present

## 2017-02-20 DIAGNOSIS — L97921 Non-pressure chronic ulcer of unspecified part of left lower leg limited to breakdown of skin: Secondary | ICD-10-CM | POA: Diagnosis not present

## 2017-02-20 DIAGNOSIS — I890 Lymphedema, not elsewhere classified: Secondary | ICD-10-CM | POA: Diagnosis not present

## 2017-02-20 DIAGNOSIS — L97829 Non-pressure chronic ulcer of other part of left lower leg with unspecified severity: Secondary | ICD-10-CM | POA: Diagnosis not present

## 2017-02-20 DIAGNOSIS — T148XXA Other injury of unspecified body region, initial encounter: Secondary | ICD-10-CM | POA: Diagnosis not present

## 2017-02-20 DIAGNOSIS — I872 Venous insufficiency (chronic) (peripheral): Secondary | ICD-10-CM | POA: Diagnosis not present

## 2017-02-20 DIAGNOSIS — L97929 Non-pressure chronic ulcer of unspecified part of left lower leg with unspecified severity: Secondary | ICD-10-CM | POA: Diagnosis not present

## 2017-02-23 DIAGNOSIS — Z7901 Long term (current) use of anticoagulants: Secondary | ICD-10-CM | POA: Diagnosis not present

## 2017-02-26 DIAGNOSIS — L97909 Non-pressure chronic ulcer of unspecified part of unspecified lower leg with unspecified severity: Secondary | ICD-10-CM | POA: Diagnosis not present

## 2017-02-26 DIAGNOSIS — L97921 Non-pressure chronic ulcer of unspecified part of left lower leg limited to breakdown of skin: Secondary | ICD-10-CM | POA: Diagnosis not present

## 2017-03-02 DIAGNOSIS — Z7901 Long term (current) use of anticoagulants: Secondary | ICD-10-CM | POA: Diagnosis not present

## 2017-03-05 DIAGNOSIS — I872 Venous insufficiency (chronic) (peripheral): Secondary | ICD-10-CM | POA: Diagnosis not present

## 2017-03-05 DIAGNOSIS — L97921 Non-pressure chronic ulcer of unspecified part of left lower leg limited to breakdown of skin: Secondary | ICD-10-CM | POA: Diagnosis not present

## 2017-03-07 DIAGNOSIS — Z7901 Long term (current) use of anticoagulants: Secondary | ICD-10-CM | POA: Diagnosis not present

## 2017-04-12 DIAGNOSIS — L03116 Cellulitis of left lower limb: Secondary | ICD-10-CM | POA: Diagnosis not present

## 2017-04-12 DIAGNOSIS — L97521 Non-pressure chronic ulcer of other part of left foot limited to breakdown of skin: Secondary | ICD-10-CM | POA: Diagnosis not present

## 2017-04-12 DIAGNOSIS — L97911 Non-pressure chronic ulcer of unspecified part of right lower leg limited to breakdown of skin: Secondary | ICD-10-CM | POA: Diagnosis not present

## 2017-04-12 DIAGNOSIS — L03115 Cellulitis of right lower limb: Secondary | ICD-10-CM | POA: Diagnosis not present

## 2017-04-12 DIAGNOSIS — L97921 Non-pressure chronic ulcer of unspecified part of left lower leg limited to breakdown of skin: Secondary | ICD-10-CM | POA: Diagnosis not present

## 2017-04-12 DIAGNOSIS — I872 Venous insufficiency (chronic) (peripheral): Secondary | ICD-10-CM | POA: Diagnosis not present

## 2017-04-12 DIAGNOSIS — I890 Lymphedema, not elsewhere classified: Secondary | ICD-10-CM | POA: Diagnosis not present

## 2017-04-13 DIAGNOSIS — Z7901 Long term (current) use of anticoagulants: Secondary | ICD-10-CM | POA: Diagnosis not present

## 2017-04-19 DIAGNOSIS — L03116 Cellulitis of left lower limb: Secondary | ICD-10-CM | POA: Diagnosis not present

## 2017-04-19 DIAGNOSIS — L97921 Non-pressure chronic ulcer of unspecified part of left lower leg limited to breakdown of skin: Secondary | ICD-10-CM | POA: Diagnosis not present

## 2017-04-19 DIAGNOSIS — L03115 Cellulitis of right lower limb: Secondary | ICD-10-CM | POA: Diagnosis not present

## 2017-04-19 DIAGNOSIS — I872 Venous insufficiency (chronic) (peripheral): Secondary | ICD-10-CM | POA: Diagnosis not present

## 2017-04-19 DIAGNOSIS — I890 Lymphedema, not elsewhere classified: Secondary | ICD-10-CM | POA: Diagnosis not present

## 2017-04-19 DIAGNOSIS — L97521 Non-pressure chronic ulcer of other part of left foot limited to breakdown of skin: Secondary | ICD-10-CM | POA: Diagnosis not present

## 2017-04-19 DIAGNOSIS — L97911 Non-pressure chronic ulcer of unspecified part of right lower leg limited to breakdown of skin: Secondary | ICD-10-CM | POA: Diagnosis not present

## 2017-04-23 DIAGNOSIS — H52203 Unspecified astigmatism, bilateral: Secondary | ICD-10-CM | POA: Diagnosis not present

## 2017-04-23 DIAGNOSIS — H35371 Puckering of macula, right eye: Secondary | ICD-10-CM | POA: Diagnosis not present

## 2017-04-23 DIAGNOSIS — H2513 Age-related nuclear cataract, bilateral: Secondary | ICD-10-CM | POA: Diagnosis not present

## 2017-04-23 DIAGNOSIS — H33301 Unspecified retinal break, right eye: Secondary | ICD-10-CM | POA: Diagnosis not present

## 2017-04-26 DIAGNOSIS — I890 Lymphedema, not elsewhere classified: Secondary | ICD-10-CM | POA: Diagnosis not present

## 2017-04-26 DIAGNOSIS — I872 Venous insufficiency (chronic) (peripheral): Secondary | ICD-10-CM | POA: Diagnosis not present

## 2017-04-26 DIAGNOSIS — L97921 Non-pressure chronic ulcer of unspecified part of left lower leg limited to breakdown of skin: Secondary | ICD-10-CM | POA: Diagnosis not present

## 2017-04-26 DIAGNOSIS — L97911 Non-pressure chronic ulcer of unspecified part of right lower leg limited to breakdown of skin: Secondary | ICD-10-CM | POA: Diagnosis not present

## 2017-04-27 DIAGNOSIS — L97909 Non-pressure chronic ulcer of unspecified part of unspecified lower leg with unspecified severity: Secondary | ICD-10-CM | POA: Diagnosis not present

## 2017-05-03 DIAGNOSIS — I890 Lymphedema, not elsewhere classified: Secondary | ICD-10-CM | POA: Diagnosis not present

## 2017-05-03 DIAGNOSIS — L97911 Non-pressure chronic ulcer of unspecified part of right lower leg limited to breakdown of skin: Secondary | ICD-10-CM | POA: Diagnosis not present

## 2017-05-03 DIAGNOSIS — L97921 Non-pressure chronic ulcer of unspecified part of left lower leg limited to breakdown of skin: Secondary | ICD-10-CM | POA: Diagnosis not present

## 2017-05-03 DIAGNOSIS — L97521 Non-pressure chronic ulcer of other part of left foot limited to breakdown of skin: Secondary | ICD-10-CM | POA: Diagnosis not present

## 2017-05-03 DIAGNOSIS — I872 Venous insufficiency (chronic) (peripheral): Secondary | ICD-10-CM | POA: Diagnosis not present

## 2017-05-07 DIAGNOSIS — Z7901 Long term (current) use of anticoagulants: Secondary | ICD-10-CM | POA: Diagnosis not present

## 2017-05-10 DIAGNOSIS — L97911 Non-pressure chronic ulcer of unspecified part of right lower leg limited to breakdown of skin: Secondary | ICD-10-CM | POA: Diagnosis not present

## 2017-05-10 DIAGNOSIS — I890 Lymphedema, not elsewhere classified: Secondary | ICD-10-CM | POA: Diagnosis not present

## 2017-05-10 DIAGNOSIS — I872 Venous insufficiency (chronic) (peripheral): Secondary | ICD-10-CM | POA: Diagnosis not present

## 2017-05-10 DIAGNOSIS — L97521 Non-pressure chronic ulcer of other part of left foot limited to breakdown of skin: Secondary | ICD-10-CM | POA: Diagnosis not present

## 2017-05-10 DIAGNOSIS — L97921 Non-pressure chronic ulcer of unspecified part of left lower leg limited to breakdown of skin: Secondary | ICD-10-CM | POA: Diagnosis not present

## 2017-05-17 DIAGNOSIS — I890 Lymphedema, not elsewhere classified: Secondary | ICD-10-CM | POA: Diagnosis not present

## 2017-05-17 DIAGNOSIS — L97921 Non-pressure chronic ulcer of unspecified part of left lower leg limited to breakdown of skin: Secondary | ICD-10-CM | POA: Diagnosis not present

## 2017-05-17 DIAGNOSIS — L97521 Non-pressure chronic ulcer of other part of left foot limited to breakdown of skin: Secondary | ICD-10-CM | POA: Diagnosis not present

## 2017-05-17 DIAGNOSIS — I872 Venous insufficiency (chronic) (peripheral): Secondary | ICD-10-CM | POA: Diagnosis not present

## 2017-05-18 DIAGNOSIS — Z7901 Long term (current) use of anticoagulants: Secondary | ICD-10-CM | POA: Diagnosis not present

## 2017-05-21 DIAGNOSIS — Z5181 Encounter for therapeutic drug level monitoring: Secondary | ICD-10-CM | POA: Diagnosis not present

## 2017-05-21 DIAGNOSIS — H35371 Puckering of macula, right eye: Secondary | ICD-10-CM | POA: Diagnosis not present

## 2017-05-21 DIAGNOSIS — K5900 Constipation, unspecified: Secondary | ICD-10-CM | POA: Diagnosis not present

## 2017-05-21 DIAGNOSIS — D500 Iron deficiency anemia secondary to blood loss (chronic): Secondary | ICD-10-CM | POA: Diagnosis not present

## 2017-05-21 DIAGNOSIS — H4311 Vitreous hemorrhage, right eye: Secondary | ICD-10-CM | POA: Diagnosis not present

## 2017-05-21 DIAGNOSIS — H43811 Vitreous degeneration, right eye: Secondary | ICD-10-CM | POA: Diagnosis not present

## 2017-05-21 DIAGNOSIS — H33301 Unspecified retinal break, right eye: Secondary | ICD-10-CM | POA: Diagnosis not present

## 2017-05-21 DIAGNOSIS — K5903 Drug induced constipation: Secondary | ICD-10-CM | POA: Diagnosis not present

## 2017-05-21 DIAGNOSIS — Z7901 Long term (current) use of anticoagulants: Secondary | ICD-10-CM | POA: Diagnosis not present

## 2017-05-24 DIAGNOSIS — L97521 Non-pressure chronic ulcer of other part of left foot limited to breakdown of skin: Secondary | ICD-10-CM | POA: Diagnosis not present

## 2017-05-24 DIAGNOSIS — I872 Venous insufficiency (chronic) (peripheral): Secondary | ICD-10-CM | POA: Diagnosis not present

## 2017-05-24 DIAGNOSIS — I890 Lymphedema, not elsewhere classified: Secondary | ICD-10-CM | POA: Diagnosis not present

## 2017-05-24 DIAGNOSIS — L97921 Non-pressure chronic ulcer of unspecified part of left lower leg limited to breakdown of skin: Secondary | ICD-10-CM | POA: Diagnosis not present

## 2017-06-01 DIAGNOSIS — Z7901 Long term (current) use of anticoagulants: Secondary | ICD-10-CM | POA: Diagnosis not present

## 2017-06-08 DIAGNOSIS — I872 Venous insufficiency (chronic) (peripheral): Secondary | ICD-10-CM | POA: Diagnosis not present

## 2017-06-08 DIAGNOSIS — I890 Lymphedema, not elsewhere classified: Secondary | ICD-10-CM | POA: Diagnosis not present

## 2017-06-08 DIAGNOSIS — L97921 Non-pressure chronic ulcer of unspecified part of left lower leg limited to breakdown of skin: Secondary | ICD-10-CM | POA: Diagnosis not present

## 2017-06-08 DIAGNOSIS — L97911 Non-pressure chronic ulcer of unspecified part of right lower leg limited to breakdown of skin: Secondary | ICD-10-CM | POA: Diagnosis not present

## 2017-06-08 DIAGNOSIS — B369 Superficial mycosis, unspecified: Secondary | ICD-10-CM | POA: Diagnosis not present

## 2017-06-15 DIAGNOSIS — D509 Iron deficiency anemia, unspecified: Secondary | ICD-10-CM | POA: Diagnosis not present

## 2017-06-15 DIAGNOSIS — I872 Venous insufficiency (chronic) (peripheral): Secondary | ICD-10-CM | POA: Diagnosis not present

## 2017-06-15 DIAGNOSIS — L97921 Non-pressure chronic ulcer of unspecified part of left lower leg limited to breakdown of skin: Secondary | ICD-10-CM | POA: Diagnosis not present

## 2017-06-15 DIAGNOSIS — L97911 Non-pressure chronic ulcer of unspecified part of right lower leg limited to breakdown of skin: Secondary | ICD-10-CM | POA: Diagnosis not present

## 2017-06-15 DIAGNOSIS — Z7901 Long term (current) use of anticoagulants: Secondary | ICD-10-CM | POA: Diagnosis not present

## 2017-06-15 DIAGNOSIS — M8580 Other specified disorders of bone density and structure, unspecified site: Secondary | ICD-10-CM | POA: Diagnosis not present

## 2017-06-15 DIAGNOSIS — Z1211 Encounter for screening for malignant neoplasm of colon: Secondary | ICD-10-CM | POA: Diagnosis not present

## 2017-06-15 DIAGNOSIS — E559 Vitamin D deficiency, unspecified: Secondary | ICD-10-CM | POA: Diagnosis not present

## 2017-06-15 DIAGNOSIS — L97909 Non-pressure chronic ulcer of unspecified part of unspecified lower leg with unspecified severity: Secondary | ICD-10-CM | POA: Diagnosis not present

## 2017-06-15 DIAGNOSIS — I890 Lymphedema, not elsewhere classified: Secondary | ICD-10-CM | POA: Diagnosis not present

## 2017-06-15 DIAGNOSIS — Z1231 Encounter for screening mammogram for malignant neoplasm of breast: Secondary | ICD-10-CM | POA: Diagnosis not present

## 2017-06-15 DIAGNOSIS — Z23 Encounter for immunization: Secondary | ICD-10-CM | POA: Diagnosis not present

## 2017-06-15 DIAGNOSIS — Z0000 Encounter for general adult medical examination without abnormal findings: Secondary | ICD-10-CM | POA: Diagnosis not present

## 2017-06-15 DIAGNOSIS — E785 Hyperlipidemia, unspecified: Secondary | ICD-10-CM | POA: Diagnosis not present

## 2017-06-21 DIAGNOSIS — I872 Venous insufficiency (chronic) (peripheral): Secondary | ICD-10-CM | POA: Diagnosis not present

## 2017-06-21 DIAGNOSIS — L97911 Non-pressure chronic ulcer of unspecified part of right lower leg limited to breakdown of skin: Secondary | ICD-10-CM | POA: Diagnosis not present

## 2017-06-21 DIAGNOSIS — I890 Lymphedema, not elsewhere classified: Secondary | ICD-10-CM | POA: Diagnosis not present

## 2017-06-21 DIAGNOSIS — L97921 Non-pressure chronic ulcer of unspecified part of left lower leg limited to breakdown of skin: Secondary | ICD-10-CM | POA: Diagnosis not present

## 2017-06-22 ENCOUNTER — Other Ambulatory Visit: Payer: Self-pay | Admitting: Unknown Physician Specialty

## 2017-06-22 DIAGNOSIS — Z780 Asymptomatic menopausal state: Secondary | ICD-10-CM

## 2017-12-17 ENCOUNTER — Other Ambulatory Visit: Payer: Self-pay | Admitting: Unknown Physician Specialty

## 2017-12-17 DIAGNOSIS — Z1239 Encounter for other screening for malignant neoplasm of breast: Secondary | ICD-10-CM

## 2017-12-26 ENCOUNTER — Ambulatory Visit (INDEPENDENT_AMBULATORY_CARE_PROVIDER_SITE_OTHER): Payer: Medicare Other

## 2017-12-26 DIAGNOSIS — Z1231 Encounter for screening mammogram for malignant neoplasm of breast: Secondary | ICD-10-CM

## 2017-12-26 DIAGNOSIS — Z1239 Encounter for other screening for malignant neoplasm of breast: Secondary | ICD-10-CM

## 2020-06-25 DEATH — deceased
